# Patient Record
Sex: Female | Born: 1975 | Race: Black or African American | Hispanic: No | Marital: Single | Smoking: Current every day smoker
Health system: Southern US, Community
[De-identification: ages and names within clinical notes are randomized; demographics above are authoritative.]

## PROBLEM LIST (undated history)

## (undated) DIAGNOSIS — K859 Acute pancreatitis without necrosis or infection, unspecified: Secondary | ICD-10-CM

## (undated) DIAGNOSIS — K7460 Unspecified cirrhosis of liver: Secondary | ICD-10-CM

## (undated) DIAGNOSIS — B1920 Unspecified viral hepatitis C without hepatic coma: Secondary | ICD-10-CM

## (undated) DIAGNOSIS — I10 Essential (primary) hypertension: Secondary | ICD-10-CM

## (undated) HISTORY — PX: APPENDECTOMY: SHX54

## (undated) HISTORY — PX: CHOLECYSTECTOMY: SHX55

---

## 2012-02-24 ENCOUNTER — Emergency Department (HOSPITAL_COMMUNITY)
Admission: EM | Admit: 2012-02-24 | Discharge: 2012-02-24 | Disposition: A | Discharge status: Home / Self Care | Payer: Self-pay | Attending: Emergency Medicine | Admitting: Emergency Medicine

## 2012-02-24 ENCOUNTER — Encounter (HOSPITAL_COMMUNITY): Payer: Self-pay

## 2012-02-24 DIAGNOSIS — IMO0002 Reserved for concepts with insufficient information to code with codable children: Secondary | ICD-10-CM

## 2012-02-24 DIAGNOSIS — S61209A Unspecified open wound of unspecified finger without damage to nail, initial encounter: Secondary | ICD-10-CM | POA: Insufficient documentation

## 2012-02-24 DIAGNOSIS — Z8719 Personal history of other diseases of the digestive system: Secondary | ICD-10-CM | POA: Insufficient documentation

## 2012-02-24 DIAGNOSIS — Z23 Encounter for immunization: Secondary | ICD-10-CM | POA: Insufficient documentation

## 2012-02-24 DIAGNOSIS — W260XXA Contact with knife, initial encounter: Secondary | ICD-10-CM | POA: Insufficient documentation

## 2012-02-24 DIAGNOSIS — Y92009 Unspecified place in unspecified non-institutional (private) residence as the place of occurrence of the external cause: Secondary | ICD-10-CM | POA: Insufficient documentation

## 2012-02-24 DIAGNOSIS — Y939 Activity, unspecified: Secondary | ICD-10-CM | POA: Insufficient documentation

## 2012-02-24 HISTORY — DX: Acute pancreatitis without necrosis or infection, unspecified: K85.90

## 2012-02-24 MED ORDER — IBUPROFEN 800 MG PO TABS
800.0000 mg | ORAL_TABLET | Freq: Once | ORAL | Status: AC
  Administered 2012-02-24: 800 mg via ORAL
  Filled 2012-02-24: qty 1

## 2012-02-24 MED ORDER — TETANUS-DIPHTHERIA TOXOIDS TD 5-2 LFU IM INJ
0.5000 mL | INJECTION | Freq: Once | INTRAMUSCULAR | Status: AC
  Administered 2012-02-24: 0.5 mL via INTRAMUSCULAR
  Filled 2012-02-24: qty 0.5
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
## 2012-02-24 NOTE — ED Notes (Signed)
Pt dc to home.  Bandages applied and pt given information for dc.  Pt states understanding to dc instructions.
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
## 2012-02-25 NOTE — ED Provider Notes (Signed)
Medical screening examination/treatment/procedure(s) were performed by non-physician practitioner and as supervising physician I was immediately available for consultation/collaboration.  Doug Sou, MD 02/25/12 [id number]

## 2014-01-13 ENCOUNTER — Encounter (HOSPITAL_COMMUNITY): Payer: Self-pay | Admitting: Cardiology

## 2014-01-13 ENCOUNTER — Emergency Department (HOSPITAL_COMMUNITY)
Admission: EM | Admit: 2014-01-13 | Discharge: 2014-01-13 | Discharge status: Against Medical Advice | Payer: Self-pay | Attending: Emergency Medicine | Admitting: Emergency Medicine

## 2014-01-13 DIAGNOSIS — M7981 Nontraumatic hematoma of soft tissue: Secondary | ICD-10-CM | POA: Insufficient documentation

## 2014-01-13 DIAGNOSIS — R109 Unspecified abdominal pain: Secondary | ICD-10-CM | POA: Insufficient documentation

## 2014-01-13 DIAGNOSIS — Z720 Tobacco use: Secondary | ICD-10-CM | POA: Insufficient documentation

## 2014-01-13 NOTE — ED Notes (Signed)
Pt reports that she noticed a bruise on the right flank area with a knot. Denies any pain at this time.

## 2014-01-13 NOTE — ED Notes (Signed)
The pt returned her pager to registration staff and left the ED, states she did not want to wait

## 2015-08-25 ENCOUNTER — Encounter (HOSPITAL_COMMUNITY): Payer: Self-pay

## 2015-08-25 ENCOUNTER — Emergency Department (HOSPITAL_COMMUNITY)
Admission: EM | Admit: 2015-08-25 | Discharge: 2015-08-26 | Disposition: A | Discharge status: Home / Self Care | Payer: Self-pay | Attending: Emergency Medicine | Admitting: Emergency Medicine

## 2015-08-25 DIAGNOSIS — Y999 Unspecified external cause status: Secondary | ICD-10-CM | POA: Insufficient documentation

## 2015-08-25 DIAGNOSIS — Y9339 Activity, other involving climbing, rappelling and jumping off: Secondary | ICD-10-CM | POA: Insufficient documentation

## 2015-08-25 DIAGNOSIS — Y929 Unspecified place or not applicable: Secondary | ICD-10-CM | POA: Insufficient documentation

## 2015-08-25 DIAGNOSIS — F17210 Nicotine dependence, cigarettes, uncomplicated: Secondary | ICD-10-CM | POA: Insufficient documentation

## 2015-08-25 DIAGNOSIS — I10 Essential (primary) hypertension: Secondary | ICD-10-CM | POA: Insufficient documentation

## 2015-08-25 DIAGNOSIS — W269XXA Contact with unspecified sharp object(s), initial encounter: Secondary | ICD-10-CM | POA: Insufficient documentation

## 2015-08-25 DIAGNOSIS — S91312A Laceration without foreign body, left foot, initial encounter: Secondary | ICD-10-CM | POA: Insufficient documentation

## 2015-08-25 HISTORY — DX: Essential (primary) hypertension: I10

## 2015-08-25 HISTORY — DX: Unspecified viral hepatitis C without hepatic coma: B19.20

## 2015-08-25 HISTORY — DX: Unspecified cirrhosis of liver: K74.60

## 2015-08-25 MED ORDER — ACETAMINOPHEN 325 MG PO TABS
650.0000 mg | ORAL_TABLET | Freq: Once | ORAL | Status: DC
  Filled 2015-08-25: qty 2
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
## 2015-08-26 MED ORDER — BACITRACIN ZINC 500 UNIT/GM EX OINT: TOPICAL_OINTMENT | Freq: Two times a day (BID) | CUTANEOUS | Status: DC

## 2015-08-26 MED ORDER — LIDOCAINE-EPINEPHRINE (PF) 2 %-1:200000 IJ SOLN
10.0000 mL | Freq: Once | INTRAMUSCULAR | Status: AC
  Administered 2015-08-26: 10 mL
  Filled 2015-08-26: qty 20

## 2015-08-26 NOTE — ED Notes (Signed)
Pt became rude and verbally abusive during discharge. Pt stated repeatedly that I "needed to hurry up and get her cab voucher" and that I "needed to hurry up and f--king wrap my foot" so she could go home. Pt threatened that she would call and complain so that I would lose my job because I was being rude. I had simply told her that we don't give cab vouchers to everyone d/t budget constraints. Pt remained rude as I escorted her out to the waiting room in a wheelchair and took her outside, as requested, to wait for her cab. Cab voucher authorized by Clydie BraunKaren, Consulting civil engineerCharge RN, and give to [place], [place]. Pt departed in NAD.

## 2015-08-26 NOTE — Discharge Instructions (Signed)
You have been seen today for a foot laceration. Follow up with PCP as needed. Return to the ED in 10 days for suture removal. Return to the ED sooner should the wound edges come apart or signs of infection arise. Ibuprofen or naproxen for pain.

## 2015-08-26 NOTE — ED Notes (Signed)
Pt refusing XR. PA at bedside for laceration repair.
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
## 2016-11-12 ENCOUNTER — Encounter (HOSPITAL_COMMUNITY): Payer: Self-pay | Admitting: Emergency Medicine

## 2016-11-12 ENCOUNTER — Emergency Department (HOSPITAL_COMMUNITY): Payer: Self-pay

## 2016-11-12 ENCOUNTER — Encounter (HOSPITAL_COMMUNITY): Payer: Self-pay | Admitting: *Deleted

## 2016-11-12 ENCOUNTER — Emergency Department (HOSPITAL_COMMUNITY)
Admission: EM | Admit: 2016-11-12 | Discharge: 2016-11-12 | Disposition: A | Discharge status: Home / Self Care | Payer: Self-pay | Attending: Physician Assistant | Admitting: Physician Assistant

## 2016-11-12 ENCOUNTER — Emergency Department (HOSPITAL_COMMUNITY)
Admission: EM | Admit: 2016-11-12 | Discharge: 2016-11-13 | Disposition: A | Discharge status: Home / Self Care | Payer: Self-pay | Attending: Emergency Medicine | Admitting: Emergency Medicine

## 2016-11-12 DIAGNOSIS — F17210 Nicotine dependence, cigarettes, uncomplicated: Secondary | ICD-10-CM | POA: Insufficient documentation

## 2016-11-12 DIAGNOSIS — B1710 Acute hepatitis C without hepatic coma: Secondary | ICD-10-CM | POA: Insufficient documentation

## 2016-11-12 DIAGNOSIS — H2710 Unspecified dislocation of lens: Secondary | ICD-10-CM

## 2016-11-12 DIAGNOSIS — H27122 Anterior dislocation of lens, left eye: Secondary | ICD-10-CM | POA: Insufficient documentation

## 2016-11-12 DIAGNOSIS — Z23 Encounter for immunization: Secondary | ICD-10-CM | POA: Insufficient documentation

## 2016-11-12 DIAGNOSIS — R4182 Altered mental status, unspecified: Secondary | ICD-10-CM | POA: Insufficient documentation

## 2016-11-12 DIAGNOSIS — N390 Urinary tract infection, site not specified: Secondary | ICD-10-CM

## 2016-11-12 DIAGNOSIS — Z9049 Acquired absence of other specified parts of digestive tract: Secondary | ICD-10-CM | POA: Insufficient documentation

## 2016-11-12 DIAGNOSIS — S0181XA Laceration without foreign body of other part of head, initial encounter: Secondary | ICD-10-CM

## 2016-11-12 DIAGNOSIS — S01311A Laceration without foreign body of right ear, initial encounter: Secondary | ICD-10-CM | POA: Insufficient documentation

## 2016-11-12 DIAGNOSIS — S01112A Laceration without foreign body of left eyelid and periocular area, initial encounter: Secondary | ICD-10-CM | POA: Insufficient documentation

## 2016-11-12 DIAGNOSIS — Y929 Unspecified place or not applicable: Secondary | ICD-10-CM | POA: Insufficient documentation

## 2016-11-12 DIAGNOSIS — K1379 Other lesions of oral mucosa: Secondary | ICD-10-CM

## 2016-11-12 DIAGNOSIS — Y9389 Activity, other specified: Secondary | ICD-10-CM | POA: Insufficient documentation

## 2016-11-12 DIAGNOSIS — S01111A Laceration without foreign body of right eyelid and periocular area, initial encounter: Secondary | ICD-10-CM | POA: Insufficient documentation

## 2016-11-12 DIAGNOSIS — Y998 Other external cause status: Secondary | ICD-10-CM | POA: Insufficient documentation

## 2016-11-12 DIAGNOSIS — K7460 Unspecified cirrhosis of liver: Secondary | ICD-10-CM | POA: Insufficient documentation

## 2016-11-12 DIAGNOSIS — T07XXXA Unspecified multiple injuries, initial encounter: Secondary | ICD-10-CM

## 2016-11-12 DIAGNOSIS — I10 Essential (primary) hypertension: Secondary | ICD-10-CM | POA: Insufficient documentation

## 2016-11-12 DIAGNOSIS — S51012A Laceration without foreign body of left elbow, initial encounter: Secondary | ICD-10-CM | POA: Insufficient documentation

## 2016-11-12 DIAGNOSIS — K859 Acute pancreatitis without necrosis or infection, unspecified: Secondary | ICD-10-CM | POA: Insufficient documentation

## 2016-11-12 DIAGNOSIS — R041 Hemorrhage from throat: Secondary | ICD-10-CM | POA: Insufficient documentation

## 2016-11-12 DIAGNOSIS — S01411A Laceration without foreign body of right cheek and temporomandibular area, initial encounter: Secondary | ICD-10-CM | POA: Insufficient documentation

## 2016-11-12 LAB — COMPREHENSIVE METABOLIC PANEL
ALT: 69 U/L — AB (ref 14–54)
ANION GAP: 7 (ref 5–15)
AST: 176 U/L — ABNORMAL HIGH (ref 15–41)
Albumin: 3.1 g/dL — ABNORMAL LOW (ref 3.5–5.0)
Alkaline Phosphatase: 76 U/L (ref 38–126)
BUN: 6 mg/dL (ref 6–20)
CO2: 21 mmol/L — AB (ref 22–32)
CREATININE: 0.62 mg/dL (ref 0.44–1.00)
Calcium: 8.2 mg/dL — ABNORMAL LOW (ref 8.9–10.3)
Chloride: 109 mmol/L (ref 101–111)
Glucose, Bld: 111 mg/dL — ABNORMAL HIGH (ref 65–99)
Potassium: 3.6 mmol/L (ref 3.5–5.1)
SODIUM: 137 mmol/L (ref 135–145)
Total Bilirubin: 0.9 mg/dL (ref 0.3–1.2)
Total Protein: 8 g/dL (ref 6.5–8.1)

## 2016-11-12 LAB — URINALYSIS, ROUTINE W REFLEX MICROSCOPIC
BILIRUBIN URINE: NEGATIVE
GLUCOSE, UA: NEGATIVE mg/dL
Ketones, ur: NEGATIVE mg/dL
NITRITE: POSITIVE — AB
Protein, ur: 100 mg/dL — AB
SPECIFIC GRAVITY, URINE: 1.015 (ref 1.005–1.030)
pH: 5 (ref 5.0–8.0)

## 2016-11-12 LAB — I-STAT CHEM 8, ED
BUN: 5 mg/dL — ABNORMAL LOW (ref 6–20)
CALCIUM ION: 1.15 mmol/L (ref 1.15–1.40)
CREATININE: 0.7 mg/dL (ref 0.44–1.00)
Chloride: 108 mmol/L (ref 101–111)
Glucose, Bld: 115 mg/dL — ABNORMAL HIGH (ref 65–99)
HCT: 31 % — ABNORMAL LOW (ref 36.0–46.0)
HEMOGLOBIN: 10.5 g/dL — AB (ref 12.0–15.0)
Potassium: 3.7 mmol/L (ref 3.5–5.1)
Sodium: 142 mmol/L (ref 135–145)
TCO2: 21 mmol/L — AB (ref 22–32)

## 2016-11-12 LAB — POC URINE PREG, ED: Preg Test, Ur: NEGATIVE

## 2016-11-12 LAB — RAPID URINE DRUG SCREEN, HOSP PERFORMED
Amphetamines: NOT DETECTED
BENZODIAZEPINES: POSITIVE — AB
Barbiturates: NOT DETECTED
COCAINE: POSITIVE — AB
OPIATES: NOT DETECTED
Tetrahydrocannabinol: NOT DETECTED

## 2016-11-12 LAB — SAMPLE TO BLOOD BANK

## 2016-11-12 LAB — I-STAT CG4 LACTIC ACID, ED: Lactic Acid, Venous: 2.53 mmol/L (ref 0.5–1.9)

## 2016-11-12 LAB — CBC
HCT: 29.9 % — ABNORMAL LOW (ref 36.0–46.0)
HEMOGLOBIN: 9.9 g/dL — AB (ref 12.0–15.0)
MCH: 35.4 pg — AB (ref 26.0–34.0)
MCHC: 33.1 g/dL (ref 30.0–36.0)
MCV: 106.8 fL — AB (ref 78.0–100.0)
PLATELETS: 72 10*3/uL — AB (ref 150–400)
RBC: 2.8 MIL/uL — AB (ref 3.87–5.11)
RDW: 15.3 % (ref 11.5–15.5)
WBC: 5.5 10*3/uL (ref 4.0–10.5)

## 2016-11-12 LAB — PROTIME-INR
INR: 1.25
Prothrombin Time: 15.6 seconds — ABNORMAL HIGH (ref 11.4–15.2)

## 2016-11-12 LAB — CDS SEROLOGY

## 2016-11-12 LAB — ETHANOL: ALCOHOL ETHYL (B): 193 mg/dL — AB (ref <5)

## 2016-11-12 MED ORDER — SODIUM CHLORIDE 0.9 % IV BOLUS (SEPSIS)
1000.0000 mL | Freq: Once | INTRAVENOUS | Status: AC
  Administered 2016-11-12: 1000 mL via INTRAVENOUS

## 2016-11-12 MED ORDER — FLUORESCEIN SODIUM 0.6 MG OP STRP
1.0000 | ORAL_STRIP | Freq: Once | OPHTHALMIC | Status: AC
  Administered 2016-11-12: 1 via OPHTHALMIC
  Filled 2016-11-12: qty 1

## 2016-11-12 MED ORDER — TETANUS-DIPHTH-ACELL PERTUSSIS 5-2.5-18.5 LF-MCG/0.5 IM SUSP
0.5000 mL | Freq: Once | INTRAMUSCULAR | Status: AC
  Administered 2016-11-12: 0.5 mL via INTRAMUSCULAR
  Filled 2016-11-12: qty 0.5

## 2016-11-12 MED ORDER — CYCLOBENZAPRINE HCL 10 MG PO TABS: 10.0000 mg | ORAL_TABLET | Freq: Two times a day (BID) | ORAL | 0 refills | Status: AC | PRN

## 2016-11-12 MED ORDER — LIDOCAINE-EPINEPHRINE (PF) 2 %-1:200000 IJ SOLN
20.0000 mL | Freq: Once | INTRAMUSCULAR | Status: AC
  Administered 2016-11-12: 20 mL
  Filled 2016-11-12: qty 20

## 2016-11-12 MED ORDER — TETRACAINE HCL 0.5 % OP SOLN
2.0000 [drp] | Freq: Once | OPHTHALMIC | Status: AC
  Administered 2016-11-12: 2 [drp] via OPHTHALMIC
  Filled 2016-11-12: qty 4

## 2016-11-12 MED ORDER — KETOROLAC TROMETHAMINE 30 MG/ML IJ SOLN
30.0000 mg | Freq: Once | INTRAMUSCULAR | Status: AC
  Administered 2016-11-12: 30 mg via INTRAVENOUS
  Filled 2016-11-12: qty 1

## 2016-11-12 MED ORDER — SODIUM CHLORIDE 0.9 % IV SOLN
1000.0000 mL | INTRAVENOUS | Status: DC
  Administered 2016-11-12: 1000 mL via INTRAVENOUS

## 2016-11-12 MED ORDER — DEXTROSE 5 % IV SOLN
1.0000 g | Freq: Once | INTRAVENOUS | Status: AC
  Administered 2016-11-12: 1 g via INTRAVENOUS
  Filled 2016-11-12: qty 10

## 2016-11-12 MED ORDER — "TRANEXAMIC ACID 5% ORAL SOLUTION "
10.0000 mL | Freq: Once | ORAL | Status: AC
  Administered 2016-11-13: 10 mL via OROMUCOSAL
  Filled 2016-11-12: qty 10

## 2016-11-12 MED ORDER — SODIUM CHLORIDE 0.9 % IV BOLUS (SEPSIS)
500.0000 mL | Freq: Once | INTRAVENOUS | Status: AC
  Administered 2016-11-12: 500 mL via INTRAVENOUS

## 2016-11-12 MED ORDER — IOPAMIDOL (ISOVUE-300) INJECTION 61%
INTRAVENOUS | Status: AC
  Administered 2016-11-12: 100 mL
  Filled 2016-11-12: qty 100

## 2016-11-12 MED ORDER — IBUPROFEN 600 MG PO TABS: 600.0000 mg | ORAL_TABLET | Freq: Four times a day (QID) | ORAL | 0 refills | Status: AC | PRN
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
## 2016-11-12 NOTE — ED Notes (Signed)
Pt given Mal[place] sandwich, applesauce and coke, sitting up in bed and eating.

## 2016-11-12 NOTE — ED Notes (Signed)
Pt ambulatory to restroom without difficulty.
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
## 2016-11-12 NOTE — ED Notes (Signed)
Pt returned from xray and c-t  C/o much pain rt face cleaned with soap and water

## 2016-11-12 NOTE — ED Notes (Signed)
Pt given taxi voucher, [place]key sandwich, OJ to get to opthalmologist. Pt given bus pass to return home from opthmology office.
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
## 2016-11-12 NOTE — ED Notes (Signed)
Pt attempted to call her friend "Tim". Voicemail left with patients passcode, last 4 digits of medical record number.
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
## 2016-11-12 NOTE — ED Notes (Signed)
Tetracaine fluorescein strip woods lamp and tonometer at the bedside

## 2016-11-12 NOTE — ED Notes (Signed)
ED Provider at bedside. 

## 2016-11-12 NOTE — ED Notes (Signed)
Pt still in x-ray

## 2016-11-12 NOTE — ED Notes (Signed)
POC Urine is negative.  Pt checked as I could not doppler or palpate baby.  Pt states that she had not taken any pregnancy test and assumed she was pregnant due to abdominal distention which she had been developing.  Pt also appears to be on her period, dark (period type color vaginal bleeding)

## 2016-11-13 LAB — SICKLE CELL SCREEN: Sickle Cell Screen: NEGATIVE

## 2016-11-13 NOTE — ED Notes (Signed)
Pt d/c instructions given to pt by Clydie Braun, Charity fundraiser. Pt verbalized understanding.

## 2016-11-13 NOTE — Discharge Instructions (Signed)
If you have any other bleeding, be sure to hold pressure for 10-15 minutes If it does not improve you will need be re-evaluated

## 2016-11-14 LAB — URINE CULTURE: Culture: >=100000 — AB

## 2016-11-15 ENCOUNTER — Telehealth: Payer: Self-pay | Admitting: Emergency Medicine
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
## 2016-11-15 NOTE — Telephone Encounter (Signed)
Post ED Visit - Positive Culture Follow-up: Successful Patient Follow-Up  Culture assessed and recommendations reviewed by:  Enzo Bi, Pharm.D.  Celedonio Miyamoto, Pharm.D., BCPS AQ-ID  Garvin Fila, Pharm.D., BCPS  Georgina Pillion, Pharm.D., BCPS  [employer], [address].D., BCPS, AAHIVP  Estella Husk, Pharm.D., BCPS, AAHIVP  Lysle Pearl, PharmD, BCPS  Casilda Carls, PharmD, BCPS  Pollyann Samples, PharmD, BCPS Sharin Mons PharmD  Positive urine culture   Patient discharged without antimicrobial prescription and treatment is now indicated  Organism is resistant to prescribed ED discharge antimicrobial  Patient with positive blood cultures  Changes discussed with ED provider: Terance Hart PA New antibiotic prescription symptom check, if + start Keflex  po bid x 7 days  Attempting to contact patient   Berle Mull 11/15/2016, 12:15 PM

## 2017-01-25 ENCOUNTER — Telehealth: Payer: Self-pay | Admitting: Emergency Medicine

## 2017-01-25 NOTE — Telephone Encounter (Signed)
Lost to followup 

## 2019-10-27 ENCOUNTER — Emergency Department (HOSPITAL_COMMUNITY)
Admission: EM | Admit: 2019-10-27 | Discharge: 2019-10-28 | Disposition: A | Discharge status: Home / Self Care | Payer: Self-pay | Attending: Emergency Medicine | Admitting: Emergency Medicine

## 2019-10-27 ENCOUNTER — Encounter (HOSPITAL_COMMUNITY): Payer: Self-pay | Admitting: Emergency Medicine

## 2019-10-27 DIAGNOSIS — R710 Precipitous drop in hematocrit: Secondary | ICD-10-CM

## 2019-10-27 DIAGNOSIS — I10 Essential (primary) hypertension: Secondary | ICD-10-CM | POA: Insufficient documentation

## 2019-10-27 DIAGNOSIS — W1839XA Other fall on same level, initial encounter: Secondary | ICD-10-CM | POA: Insufficient documentation

## 2019-10-27 DIAGNOSIS — H6121 Impacted cerumen, right ear: Secondary | ICD-10-CM | POA: Insufficient documentation

## 2019-10-27 DIAGNOSIS — Y999 Unspecified external cause status: Secondary | ICD-10-CM | POA: Insufficient documentation

## 2019-10-27 DIAGNOSIS — F17210 Nicotine dependence, cigarettes, uncomplicated: Secondary | ICD-10-CM | POA: Insufficient documentation

## 2019-10-27 DIAGNOSIS — S50319A Abrasion of unspecified elbow, initial encounter: Secondary | ICD-10-CM | POA: Insufficient documentation

## 2019-10-27 DIAGNOSIS — D649 Anemia, unspecified: Secondary | ICD-10-CM | POA: Insufficient documentation

## 2019-10-27 DIAGNOSIS — W19XXXA Unspecified fall, initial encounter: Secondary | ICD-10-CM

## 2019-10-27 DIAGNOSIS — M7918 Myalgia, other site: Secondary | ICD-10-CM | POA: Insufficient documentation

## 2019-10-27 DIAGNOSIS — M25552 Pain in left hip: Secondary | ICD-10-CM | POA: Insufficient documentation

## 2019-10-27 DIAGNOSIS — Y939 Activity, unspecified: Secondary | ICD-10-CM | POA: Insufficient documentation

## 2019-10-27 DIAGNOSIS — Y92481 Parking lot as the place of occurrence of the external cause: Secondary | ICD-10-CM | POA: Insufficient documentation

## 2019-10-27 DIAGNOSIS — R1084 Generalized abdominal pain: Secondary | ICD-10-CM | POA: Insufficient documentation

## 2019-10-27 DIAGNOSIS — I2510 Atherosclerotic heart disease of native coronary artery without angina pectoris: Secondary | ICD-10-CM | POA: Insufficient documentation

## 2019-10-27 DIAGNOSIS — S60419A Abrasion of unspecified finger, initial encounter: Secondary | ICD-10-CM | POA: Insufficient documentation

## 2019-10-27 LAB — COMPREHENSIVE METABOLIC PANEL
ALT: 26 U/L (ref 0–44)
AST: 62 U/L — ABNORMAL HIGH (ref 15–41)
Albumin: 2.6 g/dL — ABNORMAL LOW (ref 3.5–5.0)
Alkaline Phosphatase: 99 U/L (ref 38–126)
Anion gap: 10 (ref 5–15)
BUN: 5 mg/dL — ABNORMAL LOW (ref 6–20)
CO2: 19 mmol/L — ABNORMAL LOW (ref 22–32)
Calcium: 8.4 mg/dL — ABNORMAL LOW (ref 8.9–10.3)
Chloride: 98 mmol/L (ref 98–111)
Creatinine, Ser: 0.9 mg/dL (ref 0.44–1.00)
GFR calc Af Amer: >60 mL/min (ref >60)
GFR calc non Af Amer: >60 mL/min (ref >60)
Glucose, Bld: 111 mg/dL — ABNORMAL HIGH (ref 70–99)
Potassium: 3.7 mmol/L (ref 3.5–5.1)
Sodium: 127 mmol/L — ABNORMAL LOW (ref 135–145)
Total Bilirubin: 0.8 mg/dL (ref 0.3–1.2)
Total Protein: 7.7 g/dL (ref 6.5–8.1)

## 2019-10-27 LAB — RAPID URINE DRUG SCREEN, HOSP PERFORMED
Amphetamines: NOT DETECTED
Barbiturates: NOT DETECTED
Benzodiazepines: NOT DETECTED
Cocaine: POSITIVE — AB
Opiates: NOT DETECTED
Tetrahydrocannabinol: POSITIVE — AB

## 2019-10-27 LAB — CBC
HCT: 21.3 % — ABNORMAL LOW (ref 36.0–46.0)
Hemoglobin: 6.5 g/dL — CL (ref 12.0–15.0)
MCH: 27.7 pg (ref 26.0–34.0)
MCHC: 30.5 g/dL (ref 30.0–36.0)
MCV: 90.6 fL (ref 80.0–100.0)
Platelets: 132 10*3/uL — ABNORMAL LOW (ref 150–400)
RBC: 2.35 MIL/uL — ABNORMAL LOW (ref 3.87–5.11)
RDW: 18.7 % — ABNORMAL HIGH (ref 11.5–15.5)
WBC: 6.7 10*3/uL (ref 4.0–10.5)
nRBC: 0 % (ref 0.0–0.2)

## 2019-10-27 LAB — ETHANOL: Alcohol, Ethyl (B): 38 mg/dL — ABNORMAL HIGH (ref <10)

## 2019-10-27 LAB — I-STAT BETA HCG BLOOD, ED (MC, WL, AP ONLY): I-stat hCG, quantitative: <5 m[IU]/mL (ref <5)

## 2019-10-27 NOTE — ED Triage Notes (Signed)
Pt transported from gas station parking lot after witnessed fall, abrasion to elbow and finger. +crack, + etoh

## 2019-10-28 ENCOUNTER — Emergency Department (HOSPITAL_COMMUNITY): Payer: Self-pay

## 2019-10-28 ENCOUNTER — Other Ambulatory Visit: Payer: Self-pay

## 2019-10-28 LAB — ABO/RH: ABO/RH(D): O POS

## 2019-10-28 LAB — HEMOGLOBIN AND HEMATOCRIT, BLOOD
HCT: 27.2 % — ABNORMAL LOW (ref 36.0–46.0)
Hemoglobin: 8.6 g/dL — ABNORMAL LOW (ref 12.0–15.0)

## 2019-10-28 LAB — AMMONIA: Ammonia: 26 umol/L (ref 9–35)

## 2019-10-28 LAB — PROTIME-INR
INR: 1.3 — ABNORMAL HIGH (ref 0.8–1.2)
Prothrombin Time: 15.3 seconds — ABNORMAL HIGH (ref 11.4–15.2)

## 2019-10-28 LAB — POC OCCULT BLOOD, ED: Fecal Occult Bld: NEGATIVE

## 2019-10-28 LAB — PREPARE RBC (CROSSMATCH)

## 2019-10-28 MED ORDER — LORAZEPAM 1 MG PO TABS: 0.0000 mg | ORAL_TABLET | Freq: Two times a day (BID) | ORAL | Status: DC

## 2019-10-28 MED ORDER — SODIUM CHLORIDE 0.9% IV SOLUTION: Freq: Once | INTRAVENOUS | Status: DC

## 2019-10-28 MED ORDER — LORAZEPAM 1 MG PO TABS
0.0000 mg | ORAL_TABLET | Freq: Four times a day (QID) | ORAL | Status: DC
  Administered 2019-10-28: 1 mg via ORAL
  Filled 2019-10-28: qty 4

## 2019-10-28 MED ORDER — LORAZEPAM 2 MG/ML IJ SOLN: 0.0000 mg | Freq: Four times a day (QID) | INTRAMUSCULAR | Status: DC

## 2019-10-28 MED ORDER — LORAZEPAM 2 MG/ML IJ SOLN: 0.0000 mg | Freq: Two times a day (BID) | INTRAMUSCULAR | Status: DC

## 2019-10-28 MED ORDER — THIAMINE HCL 100 MG PO TABS
100.0000 mg | ORAL_TABLET | Freq: Every day | ORAL | Status: DC
  Filled 2019-10-28: qty 1

## 2019-10-28 MED ORDER — IOHEXOL 300 MG/ML  SOLN
100.0000 mL | Freq: Once | INTRAMUSCULAR | Status: AC | PRN
  Administered 2019-10-28: 100 mL via INTRAVENOUS

## 2019-10-28 MED ORDER — SODIUM CHLORIDE 0.9 % IV BOLUS
1000.0000 mL | Freq: Once | INTRAVENOUS | Status: AC
  Administered 2019-10-28: 1000 mL via INTRAVENOUS

## 2019-10-28 MED ORDER — ACETAMINOPHEN 325 MG PO TABS
650.0000 mg | ORAL_TABLET | Freq: Once | ORAL | Status: AC
  Administered 2019-10-28: 650 mg via ORAL
  Filled 2019-10-28: qty 2

## 2019-10-28 MED ORDER — THIAMINE HCL 100 MG/ML IJ SOLN
100.0000 mg | Freq: Every day | INTRAMUSCULAR | Status: DC
  Administered 2019-10-28: 100 mg via INTRAVENOUS
  Filled 2019-10-28: qty 2

## 2019-10-28 NOTE — ED Notes (Signed)
Increase rate to 145. Pt resting well Vitals WDL, Systolic much better than before at 166

## 2019-10-28 NOTE — ED Notes (Signed)
Pt complained of being cold but not chills. Checked IV site and it looked good. Obtain another temp 98.3. Wjhen inquring further Monica Roberts stated that she felt fine and jhas been cold ever since she came to the ED. Vitals look good but systolic a little high
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
## 2019-10-28 NOTE — ED Notes (Signed)
This RN has attempted x3 to call a ride for a pt. Family members and friend unable to pick pt up at this time. Pt requesting to stay overnight and I advised pt that she is stable and up for d/c. Charge RN made aware of situation and pt will be d/c to lobby. Pt is stable.

## 2019-10-28 NOTE — Discharge Instructions (Signed)
Your hemoglobin level was lower than normal.  You received a blood transfusion.  This hemoglobin level improved. We highly recommend you follow-up with your primary care provider for continued evaluation and management.
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
## 2019-10-28 NOTE — ED Notes (Signed)
Off floor to X-Ray 

## 2019-10-28 NOTE — ED Notes (Signed)
Rate changed to 155
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
## 2019-10-28 NOTE — ED Notes (Signed)
Back From CT 

## 2019-10-29 LAB — TYPE AND SCREEN
ABO/RH(D): O POS
Antibody Screen: NEGATIVE
Unit division: 0

## 2019-10-29 LAB — BPAM RBC
Blood Product Expiration Date: 202109262359
ISSUE DATE / TIME: 202108281528
Unit Type and Rh: 5100

## 2022-09-13 ENCOUNTER — Emergency Department (HOSPITAL_BASED_OUTPATIENT_CLINIC_OR_DEPARTMENT_OTHER)
Admission: EM | Admit: 2022-09-13 | Discharge: 2022-09-13 | Disposition: A | Discharge status: Home / Self Care | Payer: 59 | Attending: Emergency Medicine | Admitting: Emergency Medicine

## 2022-09-13 ENCOUNTER — Encounter (HOSPITAL_BASED_OUTPATIENT_CLINIC_OR_DEPARTMENT_OTHER): Payer: Self-pay

## 2022-09-13 DIAGNOSIS — T300 Burn of unspecified body region, unspecified degree: Secondary | ICD-10-CM | POA: Insufficient documentation

## 2022-09-13 DIAGNOSIS — I129 Hypertensive chronic kidney disease with stage 1 through stage 4 chronic kidney disease, or unspecified chronic kidney disease: Secondary | ICD-10-CM | POA: Insufficient documentation

## 2022-09-13 DIAGNOSIS — N183 Chronic kidney disease, stage 3 unspecified: Secondary | ICD-10-CM | POA: Insufficient documentation

## 2022-09-13 DIAGNOSIS — L03213 Periorbital cellulitis: Secondary | ICD-10-CM | POA: Diagnosis not present

## 2022-09-13 DIAGNOSIS — H02846 Edema of left eye, unspecified eyelid: Secondary | ICD-10-CM | POA: Diagnosis present

## 2022-09-13 DIAGNOSIS — H1032 Unspecified acute conjunctivitis, left eye: Secondary | ICD-10-CM | POA: Insufficient documentation

## 2022-09-13 MED ORDER — FLUORESCEIN SODIUM 1 MG OP STRP
1.0000 | ORAL_STRIP | Freq: Once | OPHTHALMIC | Status: AC
  Administered 2022-09-13: 1 via OPHTHALMIC
  Filled 2022-09-13: qty 1

## 2022-09-13 MED ORDER — SULFAMETHOXAZOLE-TRIMETHOPRIM 800-160 MG PO TABS: 1.0000 | ORAL_TABLET | Freq: Two times a day (BID) | ORAL | 0 refills | Status: AC

## 2022-09-13 MED ORDER — TETRACAINE HCL 0.5 % OP SOLN
2.0000 [drp] | Freq: Once | OPHTHALMIC | Status: AC
  Administered 2022-09-13: 2 [drp] via OPHTHALMIC
  Filled 2022-09-13: qty 4

## 2022-09-13 MED ORDER — ACETAMINOPHEN 500 MG PO TABS
1000.0000 mg | ORAL_TABLET | Freq: Once | ORAL | Status: AC
  Administered 2022-09-13: 1000 mg via ORAL
  Filled 2022-09-13: qty 2

## 2022-09-13 MED ORDER — TOBRADEX 0.3-0.1 % OP OINT: 1.0000 | TOPICAL_OINTMENT | Freq: Three times a day (TID) | OPHTHALMIC | 0 refills | Status: AC

## 2022-09-13 MED ORDER — AMOXICILLIN-POT CLAVULANATE 875-125 MG PO TABS: 1.0000 | ORAL_TABLET | Freq: Two times a day (BID) | ORAL | 0 refills | Status: AC
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
## 2022-09-13 NOTE — Discharge Instructions (Addendum)
Please use cold compress to left eye to help with discomfort Please follow up with Dr Allyne Gee (eye dr) if symptoms not improved over next few days Please do not pick at or scratch the rash on your eyelid  It was a pleasure caring for you today in the emergency department.  Please return to the emergency department for any worsening or worrisome symptoms.

## 2022-09-13 NOTE — ED Notes (Signed)
Visual acuity completed per order. Pt unable to see anything but top line of eye chart.
